# Patient Record
Sex: Female | Born: 1955 | Race: Black or African American | Hispanic: No | Smoking: Never smoker
Health system: Southern US, Community
[De-identification: ages and names within clinical notes are randomized; demographics above are authoritative.]

## PROBLEM LIST (undated history)

## (undated) DIAGNOSIS — I10 Essential (primary) hypertension: Secondary | ICD-10-CM

## (undated) DIAGNOSIS — E119 Type 2 diabetes mellitus without complications: Secondary | ICD-10-CM

## (undated) HISTORY — PX: SHOULDER SURGERY: SHX246

---

## 2004-09-22 ENCOUNTER — Other Ambulatory Visit: Admission: RE | Admit: 2004-09-22 | Discharge: 2004-09-22 | Payer: Self-pay | Admitting: Obstetrics and Gynecology

## 2004-11-25 ENCOUNTER — Encounter (INDEPENDENT_AMBULATORY_CARE_PROVIDER_SITE_OTHER): Payer: Self-pay | Admitting: *Deleted

## 2004-11-25 ENCOUNTER — Ambulatory Visit (HOSPITAL_COMMUNITY): Admission: RE | Admit: 2004-11-25 | Discharge: 2004-11-25 | Payer: Self-pay | Admitting: Obstetrics and Gynecology

## 2007-10-11 ENCOUNTER — Ambulatory Visit: Payer: Self-pay | Admitting: Hematology and Oncology

## 2010-10-03 NOTE — Op Note (Signed)
[name], Mandy Kidd               ACCOUNT NO.:  [id number]   MEDICAL RECORD NO.:  [id number]          PATIENT TYPE:  AMB   LOCATION:  SDC                           FACILITY:  [facility]   PHYSICIAN:  Juluis Mire, M.D.   DATE OF BIRTH:  January 23, 1956   DATE OF PROCEDURE:  11/25/2004  DATE OF DISCHARGE:                                 OPERATIVE REPORT   PREOPERATIVE DIAGNOSIS:  Endometrial polyp.   POSTOPERATIVE DIAGNOSIS:  Endometrial polyp.   OPERATIVE PROCEDURES:  1.  Paracervical block.  2.  Cervical dilatation.  3.  Hysteroscopy with resection of polyps.  4.  Endometrial curettings.   SURGEON:  Juluis Mire, M.D.   ANESTHESIA:  Paracervical block and sedation.   ESTIMATED BLOOD LOSS:  Minimal.   PACKS AND DRAINS:  None.   INTRAOPERATIVE BLOOD REPLACED:  None.   COMPLICATIONS:  None.   INDICATIONS:  As dictated in the history and physical.   PROCEDURE:  The patient was taken to the OR and placed in supine position,  after slight sedation was placed in the dorsal lithotomy position using the  Allen stirrups.  The patient was  then draped as a sterile field.  A  speculum was placed in the vaginal vault.  The cervix and vagina were  cleansed with Betadine.  A paracervical block was instituted using 1%  Nesacaine.  The cervix was secured with a single-tooth tenaculum and the  uterus sounded to 8 cm.  The cervix was serially dilated to a size 35 Pratt  dilator.  The operative hysteroscope was then introduced.  Large polyps were  noted from the anterior and posterior wall.  These were resected using the  resectoscope and sent for pathologic review.  Additional endometrial  sampling was obtained along with curettings.  There was no sign of active  bleeding or perforation.  The total deficit was actually about 90 mL.  The  fluid cord came loose spilling the  sorbitol on the floor, which increased the total deficit.  At this point in  time, the single-tooth tenaculum and speculum  were then removed.  The  patient was taken out of the dorsal lithotomy position and once alert,  transferred to recovery in good condition.       JSM/MEDQ  D:  11/25/2004  T:  11/25/2004  Job:  [id number]

## 2010-10-03 NOTE — H&P (Signed)
NAME:  Mandy Kidd, Mandy Kidd               ACCOUNT NO.:  [id number]   MEDICAL RECORD NO.:  [id number]          PATIENT TYPE:  AMB   LOCATION:  SDC                           FACILITY:  [facility]   PHYSICIAN:  Juluis Mire, M.D.   DATE OF BIRTH:  1956-04-21   DATE OF ADMISSION:  11/25/2004  DATE OF DISCHARGE:                                HISTORY & PHYSICAL   HISTORY OF PRESENT ILLNESS:  The patient is a 55 year old gravida 2, para 2,  postmenopausal female who presents for hysteroscopic evaluation.   In relation to the present admission, the patient had an FSH consistent with  menopause, she was having postmenopausal bleeding.  Subsequent saline  infusion ultrasound revealed several prominent endometrial polyps and a  simple cyst.  She now presents for hysteroscopic resection of the  endometrial polyps.   ALLERGIES:  No known drug allergies.   MEDICATIONS:  None.   PAST MEDICAL HISTORY:  Usual childhood diseases.  Does have a history of  kidney stones and urinary tract infection.  Also a past history of anemia.   PAST SURGICAL HISTORY:  None.  She has had two vaginal deliveries.   FAMILY HISTORY:  Significant in that mother, brother, and sister have a  history of insulin-dependent diabetes mellitus.  Sister also has a history  of hypertension.   SOCIAL HISTORY:  No tobacco or alcohol use.   REVIEW OF SYSTEMS:  Noncontributory.   PHYSICAL EXAMINATION:  VITAL SIGNS:  The patient is afebrile with stable  vital signs.  HEENT:  The patient is normocephalic.  Pupils equal, round, and reactive to  light and accommodation.  Extraocular movements were intact.  Sclerae and  conjunctivae clear.  Oropharynx clear.  NECK:  Without thyromegaly.  BREASTS:  No discrete masses.  LUNGS:  Clear.  HEART:  Regular rate and rhythm without murmurs or gallops.  ABDOMEN:  Benign.  No masses, organomegaly, or tenderness.  PELVIC:  Normal external genitalia.  Vaginal mucosa is clear.  Cervix is  unremarkable.  Uterus is normal size, shape, and contour.  Adnexa free of  masses or tenderness.  EXTREMITIES:  Trace edema.  NEUROLOGY:  Grossly within normal limits.   IMPRESSION:  Postmenopausal bleeding with endometrial polyp.   PLAN:  The patient will undergo hysteroscopic evaluation with resection of  the polyp.  The risks of surgery have been explained including the risks of  infection, the risk of hemorrhage that could require transfusion with the  risk of AIDS or hepatitis.  There is also the risk of hysterectomy should  there be excessive bleeding.  The risk of perforation that could lead to  injury to adjacent organs requiring further exploratory surgery.  There is  the risk of deep venous thrombosis and pulmonary embolism.  The patient  expressed understanding of indications and risks and acceptance of them.       JSM/MEDQ  D:  11/25/2004  T:  11/25/2004  Job:  [id number]

## 2018-04-30 ENCOUNTER — Emergency Department: Payer: No Typology Code available for payment source

## 2018-04-30 ENCOUNTER — Emergency Department
Admission: EM | Admit: 2018-04-30 | Discharge: 2018-04-30 | Disposition: A | Discharge status: Home / Self Care | Payer: No Typology Code available for payment source | Attending: Emergency Medicine | Admitting: Emergency Medicine

## 2018-04-30 ENCOUNTER — Other Ambulatory Visit: Payer: Self-pay

## 2018-04-30 ENCOUNTER — Encounter: Payer: Self-pay | Admitting: Emergency Medicine

## 2018-04-30 DIAGNOSIS — Y9301 Activity, walking, marching and hiking: Secondary | ICD-10-CM | POA: Diagnosis not present

## 2018-04-30 DIAGNOSIS — S59911A Unspecified injury of right forearm, initial encounter: Secondary | ICD-10-CM | POA: Diagnosis present

## 2018-04-30 DIAGNOSIS — S52134A Nondisplaced fracture of neck of right radius, initial encounter for closed fracture: Secondary | ICD-10-CM | POA: Diagnosis not present

## 2018-04-30 DIAGNOSIS — R51 Headache: Secondary | ICD-10-CM | POA: Diagnosis not present

## 2018-04-30 DIAGNOSIS — W109XXA Fall (on) (from) unspecified stairs and steps, initial encounter: Secondary | ICD-10-CM | POA: Diagnosis not present

## 2018-04-30 DIAGNOSIS — Y929 Unspecified place or not applicable: Secondary | ICD-10-CM | POA: Diagnosis not present

## 2018-04-30 DIAGNOSIS — Y999 Unspecified external cause status: Secondary | ICD-10-CM | POA: Insufficient documentation

## 2018-04-30 DIAGNOSIS — W19XXXA Unspecified fall, initial encounter: Secondary | ICD-10-CM

## 2018-04-30 MED ORDER — OXYCODONE-ACETAMINOPHEN 5-325 MG PO TABS: 1.0000 | ORAL_TABLET | Freq: Four times a day (QID) | ORAL | 0 refills | Status: DC | PRN

## 2018-04-30 NOTE — ED Provider Notes (Signed)
[facility] Emergency Department Provider Note  ____________________________________________  Time seen: Approximately 9:16 PM  I have reviewed the triage vital signs and the nursing notes.   HISTORY  Chief Complaint Fall    HPI Mandy Kidd is a 62 y.o. female who presents emergency department complaining of head injury, right arm and elbow pain status post fall.  Patient reports she was coming down a flight of stairs, missed 1 of the steps causing her to fall.  She tried to catch herself with her right arm but is unsure how she landed on same.  She did strike her head on the banister as well as the stair.  No loss of consciousness.  Patient denies any headache or neck pain but endorses significant right arm pain.  No radicular symptoms in the upper or lower extremity.  No bowel or bladder dysfunction or saddle anesthesia.  No medications for his complaint prior to arrival.  Patient drove herself.  Patient denies any other complaint at this time.    History reviewed. No pertinent past medical history.  There are no active problems to display for this patient.   History reviewed. No pertinent surgical history.  Prior to Admission medications   Medication Sig Start Date End Date Taking? Authorizing Provider  oxyCODONE-acetaminophen (PERCOCET/ROXICET) 5-325 MG tablet Take 1 tablet by mouth every 6 (six) hours as needed for severe pain. 04/30/18   Gerry Blanchfield, Delorise RoyalsJonathan D, PA-C    Allergies Patient has no known allergies.  No family history on file.  Social History Social History   Tobacco Use  . Smoking status: Not on file  Substance Use Topics  . Alcohol use: Not on file  . Drug use: Not on file     Review of Systems  Constitutional: No fever/chills.  Patient did not hit her head. Eyes: No visual changes. No discharge ENT: No upper respiratory complaints. Cardiovascular: no chest pain. Respiratory: no cough. No SOB. Gastrointestinal: No  abdominal pain.  No nausea, no vomiting.  No diarrhea.  No constipation. Musculoskeletal: Positive for right arm pain Skin: Negative for rash, abrasions, lacerations, ecchymosis. Neurological: Negative for headaches, focal weakness or numbness. 10-point ROS otherwise negative.  ____________________________________________   PHYSICAL EXAM:  VITAL SIGNS: ED Triage Vitals  Enc Vitals Group     BP 04/30/18 2043 (!) 160/69     Pulse Rate 04/30/18 2043 (!) 104     Resp 04/30/18 2043 18     Temp 04/30/18 2043 97.8 F (36.6 C)     Temp Source 04/30/18 2043 Oral     SpO2 04/30/18 2043 99 %     Weight 04/30/18 2044 260 lb (117.9 kg)     Height 04/30/18 2044 5\' 6"  (1.676 m)     Head Circumference --      Peak Flow --      Pain Score 04/30/18 2043 8     Pain Loc --      Pain Edu? --      Excl. in GC? --      Constitutional: Alert and oriented. Well appearing and in no acute distress. Eyes: Conjunctivae are normal. PERRL. EOMI. Head: Small hematoma noted to the right parietal/frontal region.  Patient with no abrasions or lacerations.  Patient is tender to palpation over hematoma with no tenderness palpation of the osseous structures of the skull and face.  No palpable abnormality or crepitus.  No battle signs, raccoon eyes, serosanguineous fluid drainage from the ears or nares. ENT:  Ears:       Nose: No congestion/rhinnorhea.      Mouth/Throat: Mucous membranes are moist.  Neck: No stridor.  No cervical spine tenderness to palpation  Cardiovascular: Normal rate, regular rhythm. Normal S1 and S2.  Good peripheral circulation. Respiratory: Normal respiratory effort without tachypnea or retractions. Lungs CTAB. Good air entry to the bases with no decreased or absent breath sounds. Musculoskeletal: Full range of motion to all extremities. No gross deformities appreciated.  Visualization of the right upper extremity reveals no gross signs of trauma with abrasions, lacerations,  ecchymosis, deformity.  Patient has limited range of motion of her elbow due to pain.  She is tender to palpation over the olecranon process, radial head, distal third of the humerus.  No palpable abnormality.  Examination of the shoulder and wrist is unremarkable.  Radial pulse intact distally.  Sensation intact distally. Neurologic:  Normal speech and language. No gross focal neurologic deficits are appreciated.  Cranial nerves II through XII grossly intact. Skin:  Skin is warm, dry and intact. No rash noted. Psychiatric: Mood and affect are normal. Speech and behavior are normal. Patient exhibits appropriate insight and judgement.   ____________________________________________   LABS (all labs ordered are listed, but only abnormal results are displayed)  Labs Reviewed - No data to display ____________________________________________  EKG   ____________________________________________  RADIOLOGY I personally viewed and evaluated these images as part of my medical decision making, as well as reviewing the written report by the radiologist.  I concur with radiologist finding of nondisplaced radial neck fracture.  Dg Elbow Complete Right  Result Date: 04/30/2018 CLINICAL DATA:  Fall down stairs.  Right upper arm pain. EXAM: RIGHT ELBOW - COMPLETE 3+ VIEW COMPARISON:  None. FINDINGS: There is a right elbow joint effusion. Nondisplaced fracture noted through the right radial neck. No subluxation or dislocation. IMPRESSION: Nondisplaced radial neck fracture with associated joint effusion. Electronically Signed   By: Charlett Nose M.D.   On: 04/30/2018 21:51   Ct Head Wo Contrast  Result Date: 04/30/2018 CLINICAL DATA:  Missed a step at work tonight leading to fall striking right arm and head. No loss of consciousness. Facial trauma, fx suspected; C-spine fx, traumatic EXAM: CT HEAD WITHOUT CONTRAST CT CERVICAL SPINE WITHOUT CONTRAST TECHNIQUE: Multidetector CT imaging of the head and  cervical spine was performed following the standard protocol without intravenous contrast. Multiplanar CT image reconstructions of the cervical spine were also generated. COMPARISON:  None. FINDINGS: CT HEAD FINDINGS Brain: No intracranial hemorrhage, mass effect, or midline shift. No hydrocephalus. Incidental cavum septum pellucidum, normal variant. The basilar cisterns are patent. No evidence of territorial infarct or acute ischemia. No extra-axial or intracranial fluid collection. Vascular: No hyperdense vessel or unexpected calcification. Skull: No fracture or focal lesion. Sinuses/Orbits: Trace mucosal thickening of right ethmoid air cells. No sinus fluid level or evidence of fracture. Mastoid air cells are clear. Other: None. CT CERVICAL SPINE FINDINGS Alignment: Straightening of normal lordosis. No traumatic subluxation. Skull base and vertebrae: No acute fracture. Vertebral body heights are maintained. The dens and skull base are intact. Soft tissues and spinal canal: No prevertebral fluid or swelling. No visible canal hematoma. Disc levels: Minor endplate spurring, less than typically seen for patient's age. Minimal C5-C6 disc space narrowing. Upper chest: Negative. Other: None. IMPRESSION: 1. No acute intracranial abnormality. No skull fracture. 2. No fracture or subluxation of the cervical spine. Electronically Signed   By: Narda Rutherford M.D.   On: 04/30/2018 22:01  Ct Cervical Spine Wo Contrast  Result Date: 04/30/2018 CLINICAL DATA:  Missed a step at work tonight leading to fall striking right arm and head. No loss of consciousness. Facial trauma, fx suspected; C-spine fx, traumatic EXAM: CT HEAD WITHOUT CONTRAST CT CERVICAL SPINE WITHOUT CONTRAST TECHNIQUE: Multidetector CT imaging of the head and cervical spine was performed following the standard protocol without intravenous contrast. Multiplanar CT image reconstructions of the cervical spine were also generated. COMPARISON:  None. FINDINGS:  CT HEAD FINDINGS Brain: No intracranial hemorrhage, mass effect, or midline shift. No hydrocephalus. Incidental cavum septum pellucidum, normal variant. The basilar cisterns are patent. No evidence of territorial infarct or acute ischemia. No extra-axial or intracranial fluid collection. Vascular: No hyperdense vessel or unexpected calcification. Skull: No fracture or focal lesion. Sinuses/Orbits: Trace mucosal thickening of right ethmoid air cells. No sinus fluid level or evidence of fracture. Mastoid air cells are clear. Other: None. CT CERVICAL SPINE FINDINGS Alignment: Straightening of normal lordosis. No traumatic subluxation. Skull base and vertebrae: No acute fracture. Vertebral body heights are maintained. The dens and skull base are intact. Soft tissues and spinal canal: No prevertebral fluid or swelling. No visible canal hematoma. Disc levels: Minor endplate spurring, less than typically seen for patient's age. Minimal C5-C6 disc space narrowing. Upper chest: Negative. Other: None. IMPRESSION: 1. No acute intracranial abnormality. No skull fracture. 2. No fracture or subluxation of the cervical spine. Electronically Signed   By: Narda Rutherford M.D.   On: 04/30/2018 22:01   Dg Humerus Right  Result Date: 04/30/2018 CLINICAL DATA:  Fall down stairs.  Right humeral pain EXAM: RIGHT HUMERUS - 2+ VIEW COMPARISON:  None. FINDINGS: There is no evidence of fracture or other focal bone lesions. Soft tissues are unremarkable. IMPRESSION: Negative. Electronically Signed   By: Charlett Nose M.D.   On: 04/30/2018 21:50    ____________________________________________    PROCEDURES  Procedure(s) performed:    .Splint Application Date/Time: 04/30/2018 10:11 PM Performed by: Racheal Patches, PA-C Authorized by: Racheal Patches, PA-C   Consent:    Consent obtained:  Verbal   Consent given by:  Patient   Risks discussed:  Pain and swelling Pre-procedure details:    Sensation:   Normal Procedure details:    Laterality:  Right   Location:  Elbow   Elbow:  R elbow   Splint type:  Long arm   Supplies:  Sling Post-procedure details:    Pain:  Improved   Sensation:  Normal   Patient tolerance of procedure:  Tolerated well, no immediate complications      Medications - No data to display   ____________________________________________   INITIAL IMPRESSION / ASSESSMENT AND PLAN / ED COURSE  Pertinent labs & imaging results that were available during my care of the patient were reviewed by me and considered in my medical decision making (see chart for details).  Review of the [place] CSRS was performed in accordance of the [facility] prior to dispensing any controlled drugs.      Patient's diagnosis is consistent with fall resulting in radial neck fracture.  Patient presents emergency department after falling on her flight of stairs.  She did hit her head but did not lose consciousness.  Overall, neuro exam is very reassuring.  However, given mechanism of injury CT scan of the head and neck are ordered.  These returned without any acute intracranial or osseous abnormality to the head or neck.  Patient was also evaluated with x-rays to her right upper  extremity.  This returns with nondisplaced radial neck fracture.  Patient is placed in a long-arm splint and given a sling.  Follow-up with orthopedics for further management.  Patient will be prescribed Percocet for pain relief..  Patient is given ED precautions to return to the ED for any worsening or new symptoms.     ____________________________________________  FINAL CLINICAL IMPRESSION(S) / ED DIAGNOSES  Final diagnoses:  Closed nondisplaced fracture of neck of right radius, initial encounter  Fall, initial encounter      NEW MEDICATIONS STARTED DURING THIS VISIT:  ED Discharge Orders         Ordered    oxyCODONE-acetaminophen (PERCOCET/ROXICET) 5-325 MG tablet  Every 6 hours PRN     04/30/18 2201               This chart was dictated using voice recognition software/Dragon. Despite best efforts to proofread, errors can occur which can change the meaning. Any change was purely unintentional.    Racheal Patches, PA-C 04/30/18 2211    Jene Every, MD 04/30/18 2245

## 2018-04-30 NOTE — ED Notes (Signed)
Pt states that while at work tonight she was missed a step and fell, landing on her right arm and her head. Pt denies LOC. Pt has small knot on front of head. Right arm is swollen and giving her 8/10 pain

## 2018-04-30 NOTE — ED Triage Notes (Signed)
Patient reports she miss stepped and fell down 2 steps.  Reports hit head on railing (denies loss of consciousness) and landed on right side.  Patient mostly complains of right arm pain.

## 2018-07-15 ENCOUNTER — Emergency Department
Admission: EM | Admit: 2018-07-15 | Discharge: 2018-07-15 | Disposition: A | Discharge status: Home / Self Care | Payer: No Typology Code available for payment source | Attending: Emergency Medicine | Admitting: Emergency Medicine

## 2018-07-15 ENCOUNTER — Encounter: Payer: Self-pay | Admitting: Emergency Medicine

## 2018-07-15 ENCOUNTER — Emergency Department: Payer: No Typology Code available for payment source

## 2018-07-15 DIAGNOSIS — Z79899 Other long term (current) drug therapy: Secondary | ICD-10-CM | POA: Diagnosis not present

## 2018-07-15 DIAGNOSIS — M25511 Pain in right shoulder: Secondary | ICD-10-CM | POA: Diagnosis not present

## 2018-07-15 DIAGNOSIS — G8929 Other chronic pain: Secondary | ICD-10-CM | POA: Diagnosis not present

## 2018-07-15 NOTE — ED Triage Notes (Signed)
Pt reports fell in December and hurt her arm at work and has been getting PT for it. Pt reports now they think her rotator cuff is injured so they advised her to get a xray of it. Denies recent or new injuries. Pt right arm is injured.

## 2018-07-15 NOTE — ED Provider Notes (Signed)
[facility] Emergency Department Provider Note  ____________________________________________  Time seen: Approximately 2:33 PM  I have reviewed the triage vital signs and the nursing notes.   HISTORY  Chief Complaint Arm Injury    HPI Mandy Kidd is a 63 y.o. female that presents emergency department for evaluation of right shoulder pain for 2 months.  Certain movements elicit pain.  Pain is primarily over the top.  Patient states that she is going to physical therapy for a Workmen's Comp. injury that happened back in December.  Physical therapy suggested that she have an x-ray completed, as a suspect that there is a rotator cuff tear.  She is seeing orthopedics in [place].  No recent trauma.  History reviewed. No pertinent past medical history.  There are no active problems to display for this patient.   History reviewed. No pertinent surgical history.  Prior to Admission medications   Medication Sig Start Date End Date Taking? Authorizing Provider  oxyCODONE-acetaminophen (PERCOCET/ROXICET) 5-325 MG tablet Take 1 tablet by mouth every 6 (six) hours as needed for severe pain. 04/30/18   Cuthriell, Delorise Royals, PA-C    Allergies Patient has no known allergies.  No family history on file.  Social History Social History   Tobacco Use  . Smoking status: Not on file  Substance Use Topics  . Alcohol use: Not on file  . Drug use: Not on file     Review of Systems  Cardiovascular: No chest pain. Respiratory: No SOB. Musculoskeletal: Positive for shoulder pain. Skin: Negative for rash, abrasions, lacerations, ecchymosis. Neurological: Negative for numbness or tingling   ____________________________________________   PHYSICAL EXAM:  VITAL SIGNS: ED Triage Vitals  Enc Vitals Group     BP 07/15/18 1317 (!) 142/65     Pulse Rate 07/15/18 1317 80     Resp 07/15/18 1317 20     Temp 07/15/18 1317 98.2 F (36.8 C)     Temp Source 07/15/18  1317 Oral     SpO2 07/15/18 1317 97 %     Weight 07/15/18 1309 230 lb (104.3 kg)     Height 07/15/18 1309 5\' 6"  (1.676 m)     Head Circumference --      Peak Flow --      Pain Score 07/15/18 1309 6     Pain Loc --      Pain Edu? --      Excl. in GC? --      Constitutional: Alert and oriented. Well appearing and in no acute distress. Eyes: Conjunctivae are normal. PERRL. EOMI. Head: Atraumatic. ENT:      Ears:      Nose: No congestion/rhinnorhea.      Mouth/Throat: Mucous membranes are moist.  Neck: No stridor.  Cardiovascular: Normal rate, regular rhythm.  Good peripheral circulation. Respiratory: Normal respiratory effort without tachypnea or retractions. Lungs CTAB. Good air entry to the bases with no decreased or absent breath sounds. Musculoskeletal: Full range of motion to all extremities. No gross deformities appreciated.  Minimal tenderness to palpation over superior shoulder.  Full range of motion of shoulder.  Strength equal in upper extremities bilaterally. Neurologic:  Normal speech and language. No gross focal neurologic deficits are appreciated.  Skin:  Skin is warm, dry and intact. No rash noted. Psychiatric: Mood and affect are normal. Speech and behavior are normal. Patient exhibits appropriate insight and judgement.   ____________________________________________   LABS (all labs ordered are listed, but only abnormal results are displayed)  Labs  Reviewed - No data to display ____________________________________________  EKG   ____________________________________________  RADIOLOGY  No results found.  ____________________________________________    PROCEDURES  Procedure(s) performed:    Procedures    Medications - No data to display   ____________________________________________   INITIAL IMPRESSION / ASSESSMENT AND PLAN / ED COURSE  Pertinent labs & imaging results that were available during my care of the patient were reviewed by me  and considered in my medical decision making (see chart for details).  Review of the [place] CSRS was performed in accordance of the [facility] prior to dispensing any controlled drugs.   Patient presented to the emergency department requesting an x-ray of right shoulder for chronic pain and trauma 2 months ago.  We discussed that an x-ray would not show a rotator cuff tear.  Patient received a call from her Worker's Comp. case worker and was told that she have to get prior authorization for an x-ray.  Patient declines having any imaging today and will follow-up with her surgeon in [place] and [place].  She will continue to take her home prescription of Percocet for pain. Patient is given ED precautions to return to the ED for any worsening or new symptoms.     ____________________________________________  FINAL CLINICAL IMPRESSION(S) / ED DIAGNOSES  Final diagnoses:  Chronic right shoulder pain      NEW MEDICATIONS STARTED DURING THIS VISIT:  ED Discharge Orders    None          This chart was dictated using voice recognition software/Dragon. Despite best efforts to proofread, errors can occur which can change the meaning. Any change was purely unintentional.    Enid Derry, PA-C 07/15/18 1522    Arnaldo Natal, MD 07/15/18 [phone]

## 2018-07-15 NOTE — ED Notes (Signed)

## 2019-11-11 IMAGING — CT CT CERVICAL SPINE W/O CM
3 of 7 series · 10 of 33 positions shown, 11 images · non-contrast
Comparison: None.

CLINICAL DATA: Missed a step at work tonight leading to fall
striking right arm and head. No loss of consciousness. Facial
trauma, fx suspected; C-spine fx, traumatic

EXAM:
CT HEAD WITHOUT CONTRAST
CT CERVICAL SPINE WITHOUT CONTRAST
TECHNIQUE: Multidetector CT imaging of the head and cervical spine was
performed following the standard protocol without intravenous
contrast. Multiplanar CT image reconstructions of the cervical spine
were also generated.

[Series 6: sagittal bone · sagittal · 0.20mm/px · 5 of 69 slices shown]
[im 10/69  bone]
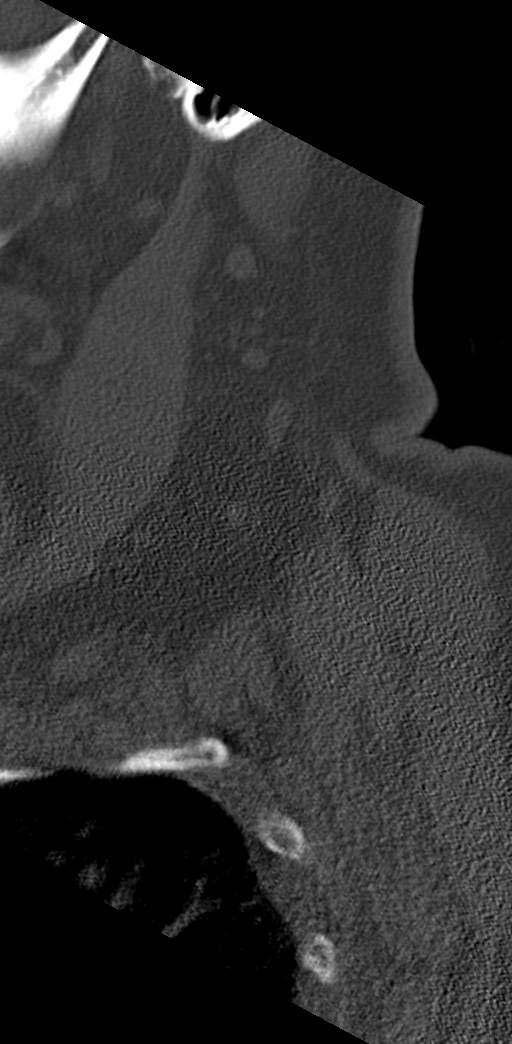
[im 20/69  bone]
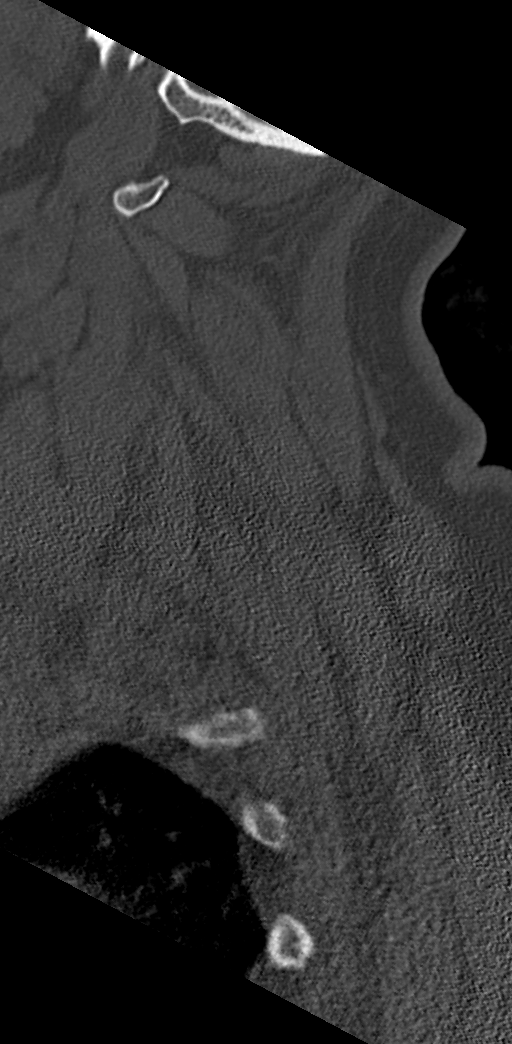
[im 30/69  bone]
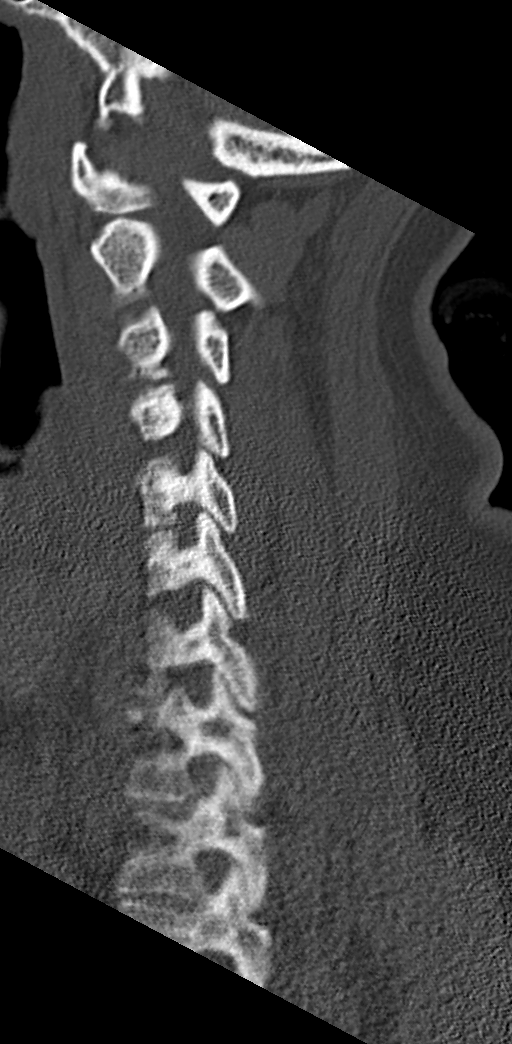
[im 39/69  bone]
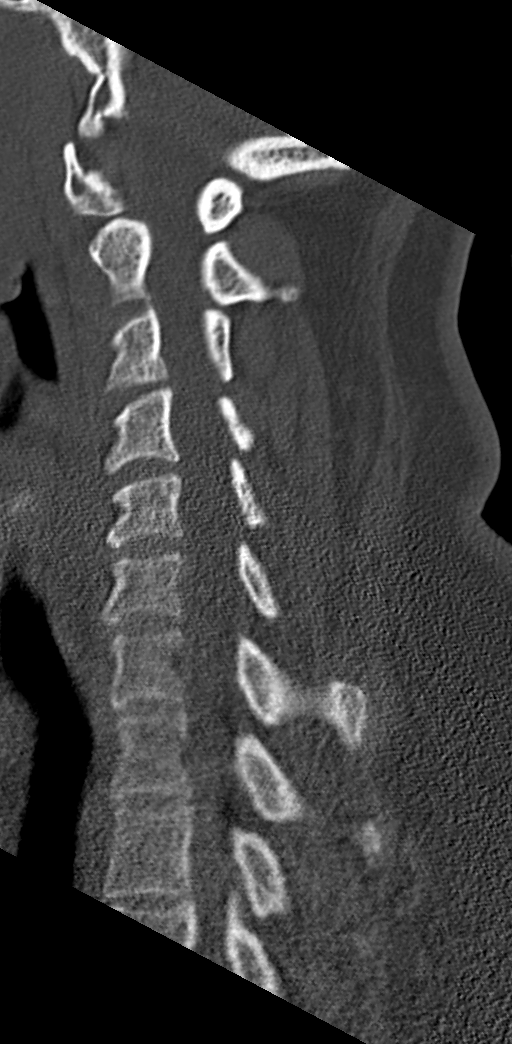
[im 49/69  bone]
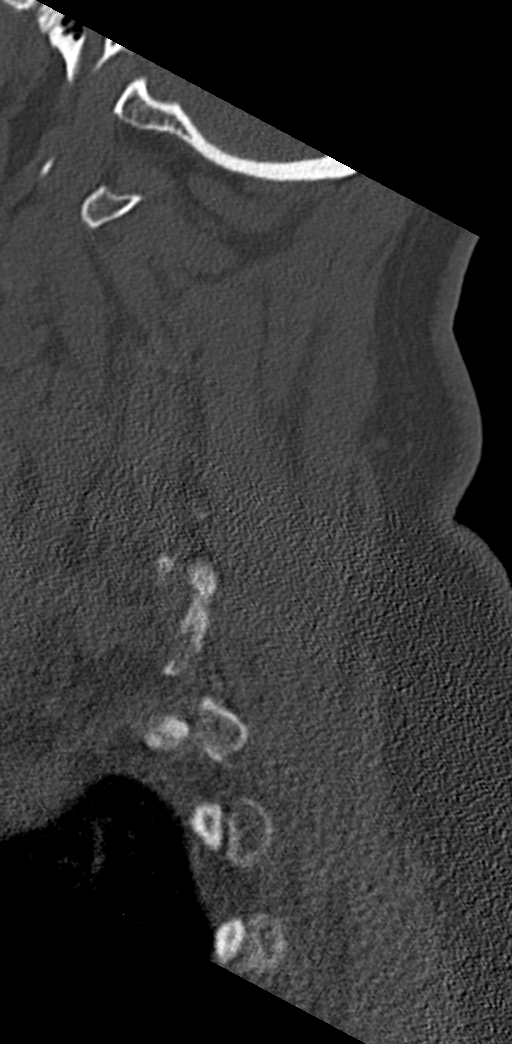

[Series 9: coronal bone · coronal · 0.27mm/px · 3 of 51 slices shown]
[im 13/51  bone]
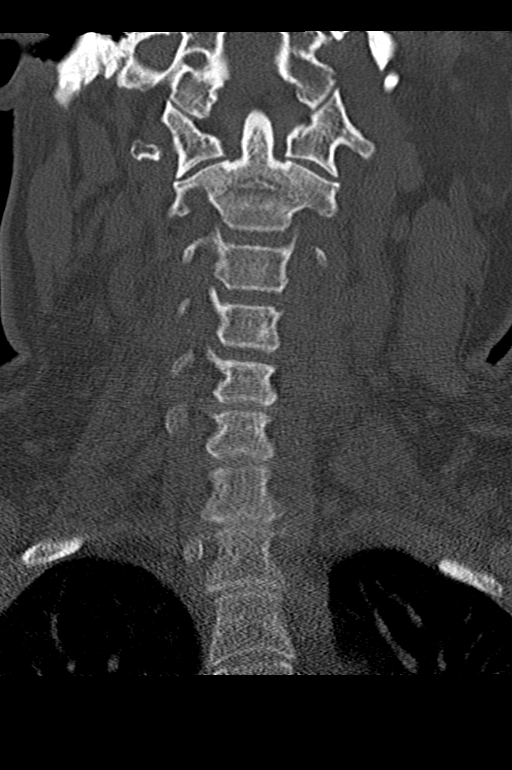
[im 26/51  bone]
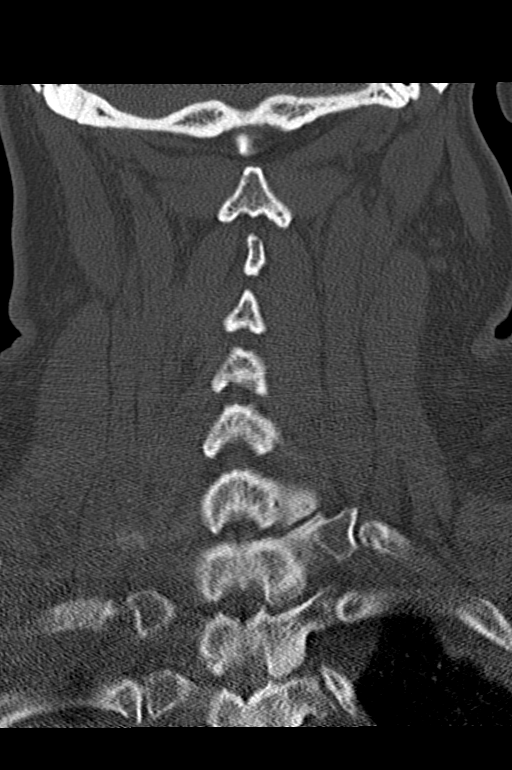
[im 38/51  bone]
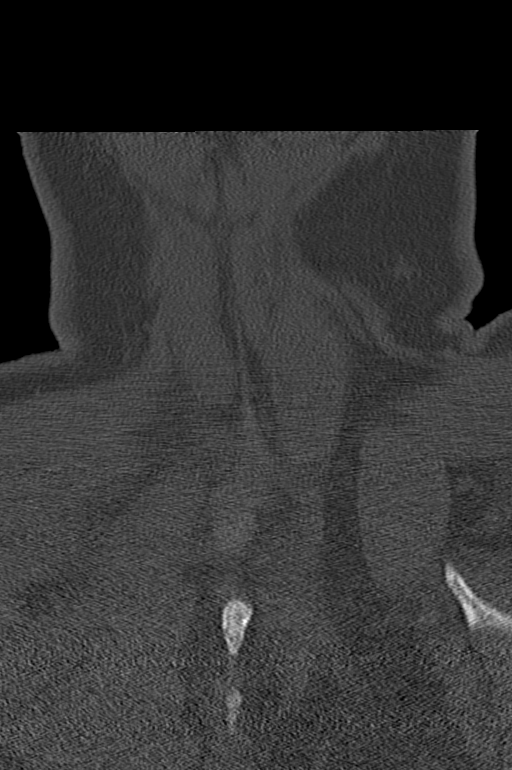

[Series 10: orthogonal bone · axial · 0.20mm/px · z∈[-304,-244]mm · 2 of 104 slices shown, 3 images]
[im 35/104  soft-tissue]
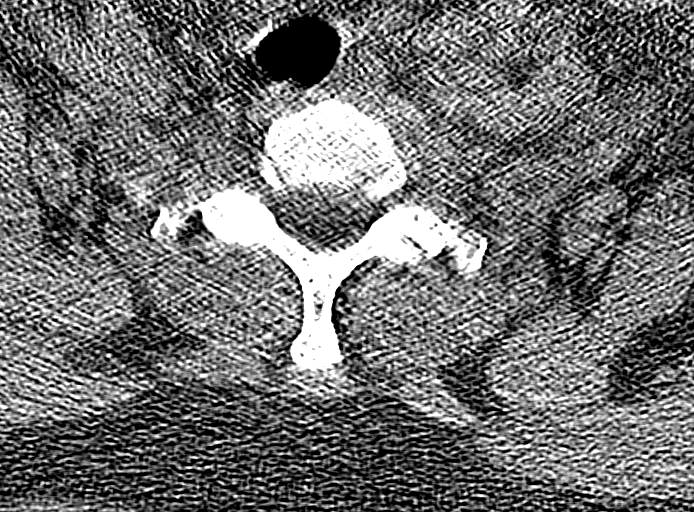
[im 35/104  bone]
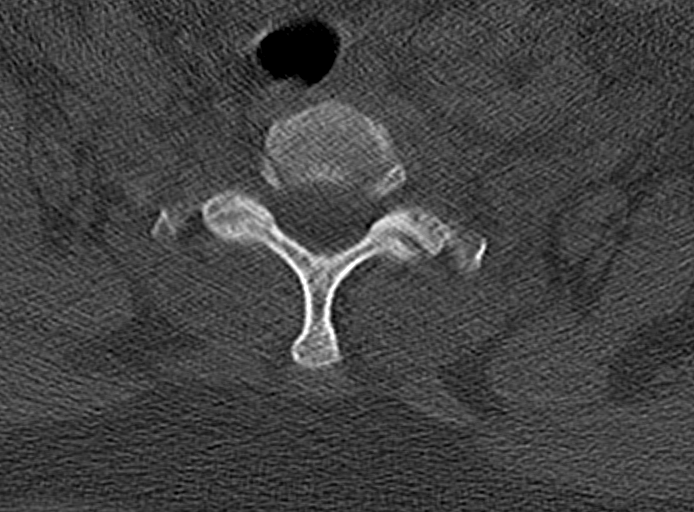
[im 69/104  bone]
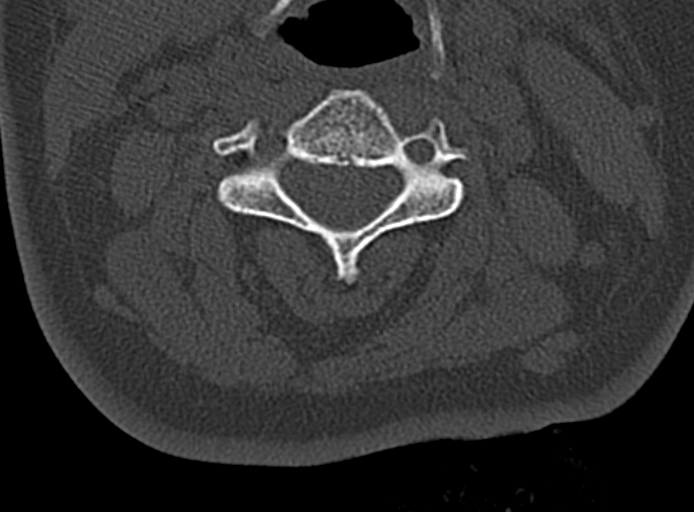

[10 of 33 positions shown; findings below may reference images not displayed]

FINDINGS: CT HEAD FINDINGS

Brain: No intracranial hemorrhage, mass effect, or midline shift. No
hydrocephalus. Incidental cavum septum pellucidum, normal variant.
The basilar cisterns are patent. No evidence of territorial infarct
or acute ischemia. No extra-axial or intracranial fluid collection.

Vascular: No hyperdense vessel or unexpected calcification.

Skull: No fracture or focal lesion.

Sinuses/Orbits: Trace mucosal thickening of right ethmoid air cells.
No sinus fluid level or evidence of fracture. Mastoid air cells are
clear.

Other: None.

CT CERVICAL SPINE FINDINGS

Alignment: Straightening of normal lordosis. No traumatic
subluxation.

Skull base and vertebrae: No acute fracture. Vertebral body heights
are maintained. The dens and skull base are intact.

Soft tissues and spinal canal: No prevertebral fluid or swelling. No
visible canal hematoma.

Disc levels: Minor endplate spurring, less than typically seen for
patient's age. Minimal C5-C6 disc space narrowing.

Upper chest: Negative.

Other: None.
IMPRESSION: 1. No acute intracranial abnormality. No skull fracture.
2. No fracture or subluxation of the cervical spine.

## 2019-11-11 IMAGING — CR DG ELBOW COMPLETE 3+V*R*
1 series · 5 of 5 positions shown · non-contrast
Comparison: None.

CLINICAL DATA: Fall down stairs.  Right upper arm pain.

EXAM:
RIGHT ELBOW - COMPLETE 3+ VIEW

[Series 1: dg elbow complete right (3+view) · 0.14mm/px · 5 of 5 slices shown]
[im 1/5]
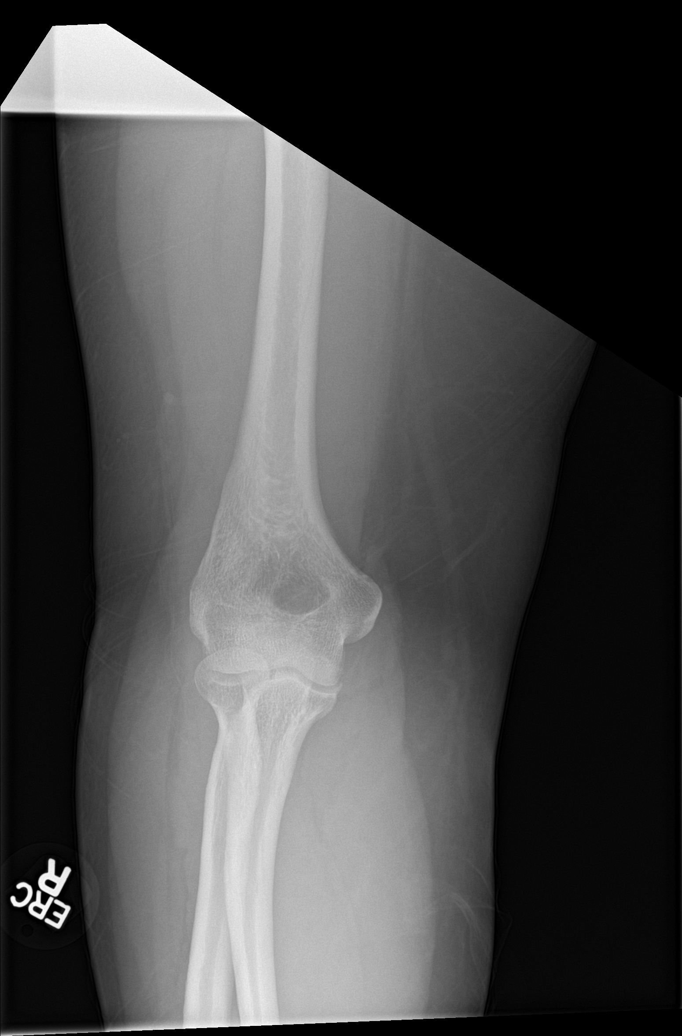
[im 2/5]
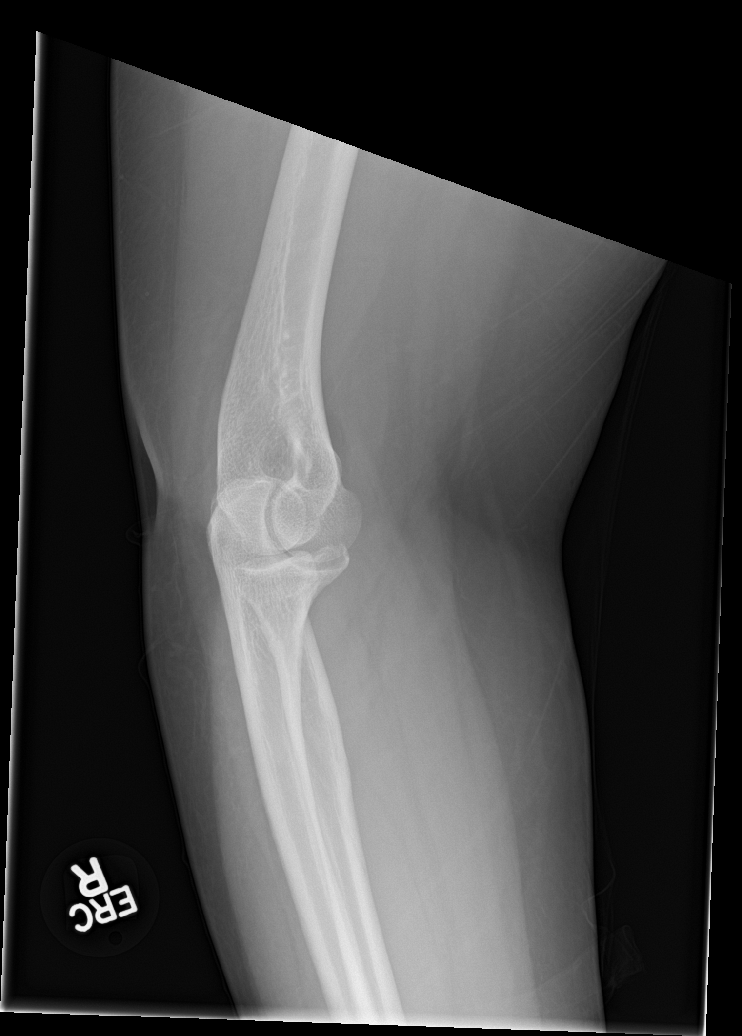
[im 3/5]
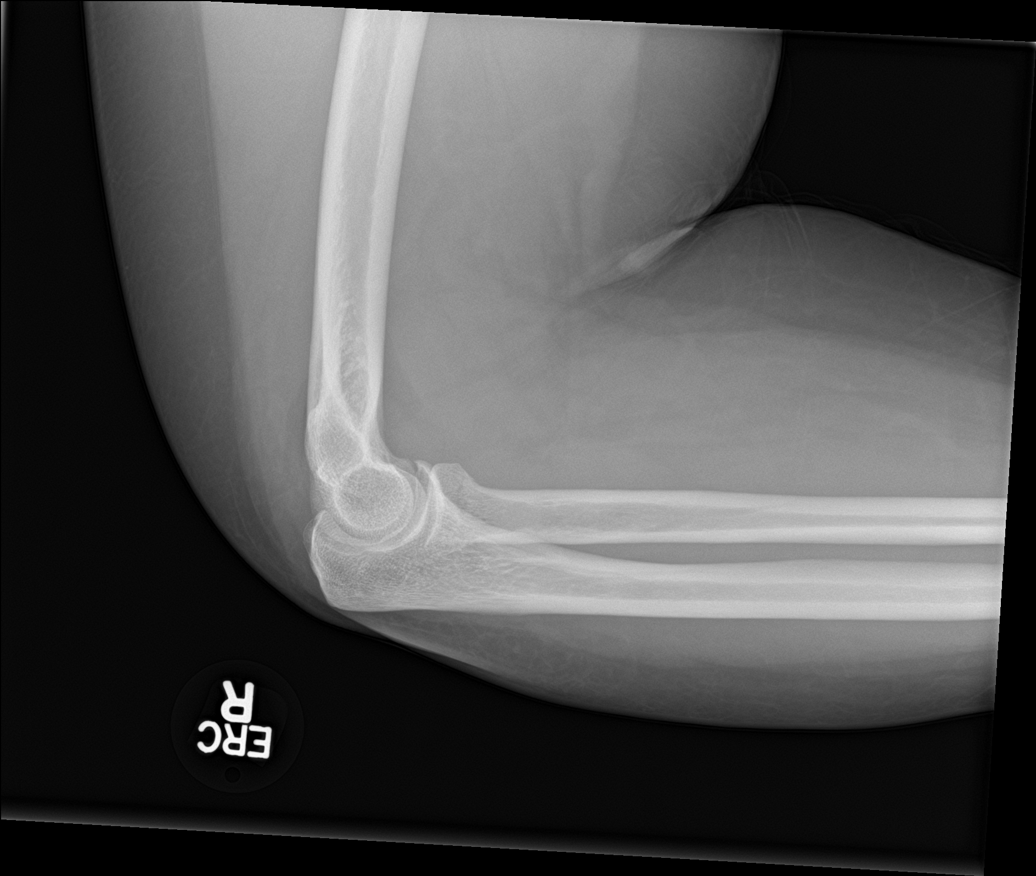
[im 4/5]
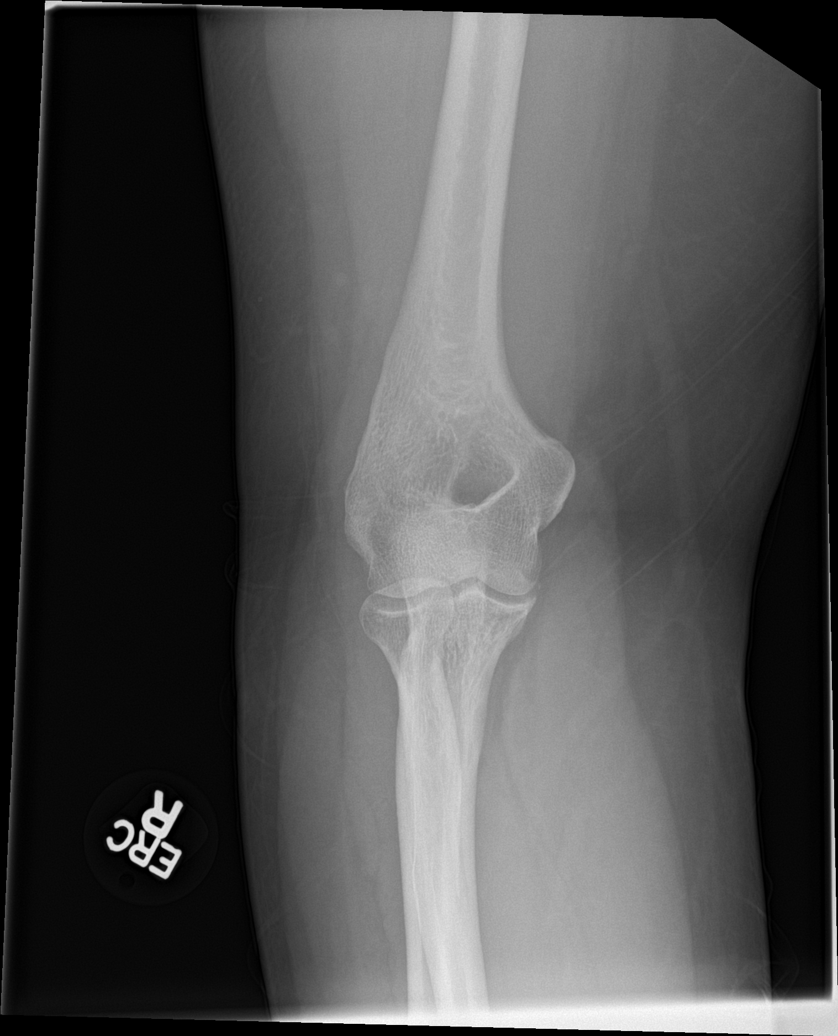
[im 5/5]
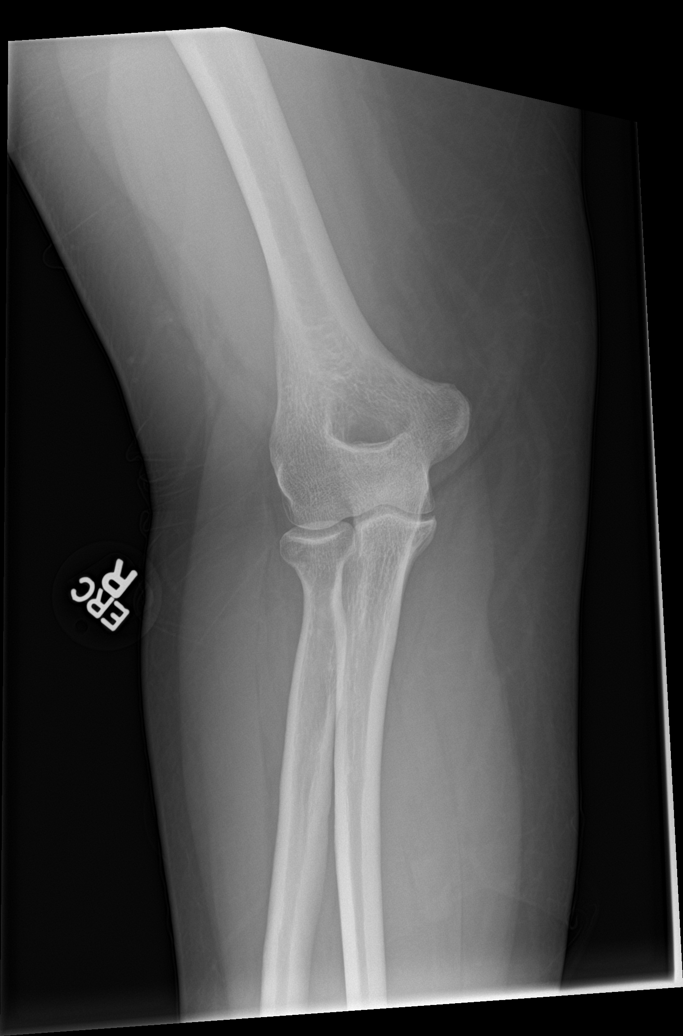

[5 of 5 positions shown; findings below may reference images not displayed]

FINDINGS: There is a right elbow joint effusion. Nondisplaced fracture noted
through the right radial neck. No subluxation or dislocation.
IMPRESSION: Nondisplaced radial neck fracture with associated joint effusion.

## 2019-12-28 ENCOUNTER — Other Ambulatory Visit: Payer: Self-pay

## 2019-12-28 ENCOUNTER — Encounter: Payer: Self-pay | Admitting: Emergency Medicine

## 2019-12-28 ENCOUNTER — Ambulatory Visit
Admission: EM | Admit: 2019-12-28 | Discharge: 2019-12-28 | Disposition: A | Discharge status: Home / Self Care | Payer: BC Managed Care – PPO | Attending: Family Medicine | Admitting: Family Medicine

## 2019-12-28 DIAGNOSIS — R109 Unspecified abdominal pain: Secondary | ICD-10-CM

## 2019-12-28 HISTORY — DX: Type 2 diabetes mellitus without complications: E11.9

## 2019-12-28 HISTORY — DX: Essential (primary) hypertension: I10

## 2019-12-28 LAB — URINALYSIS, COMPLETE (UACMP) WITH MICROSCOPIC
Bacteria, UA: NONE SEEN
Bilirubin Urine: NEGATIVE
Glucose, UA: NEGATIVE mg/dL
Ketones, ur: NEGATIVE mg/dL
Leukocytes,Ua: NEGATIVE
Nitrite: NEGATIVE
Protein, ur: NEGATIVE mg/dL
Specific Gravity, Urine: 1.015 (ref 1.005–1.030)
Squamous Epithelial / HPF: NONE SEEN (ref 0–5)
pH: 6 (ref 5.0–8.0)

## 2019-12-28 MED ORDER — TRAMADOL HCL 50 MG PO TABS: 50.0000 mg | ORAL_TABLET | Freq: Three times a day (TID) | ORAL | 0 refills | Status: AC | PRN

## 2019-12-28 NOTE — ED Provider Notes (Signed)
[facility]    CSN: [id number] Arrival date & time: 12/28/19  1305      History   Chief Complaint Chief Complaint  Patient presents with  . Dysuria  . Back Pain   HPI  64 year old female presents with concerned that she has a "kidney infection".  This appears to be an ongoing/chronic problem.  She is followed by urology.  Most recent visit to urology reviewed.  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  It looks like the plan is to have her follow-up with Dr. Celso Sickle for consideration of pyeloplasty versus trial of stent versus supportive care.  Patient reports that she is had symptoms for the past 2 months.  She reports left flank pain/back pain.  She also reports malodorous urine and left lower abdominal pain.  No relieving factors.  No other reported symptoms.  No other complaints or concerns this time.  Past Medical History:  Diagnosis Date  . Diabetes mellitus without complication (HCC)   . Hypertension    Past Surgical History:  Procedure Laterality Date  . SHOULDER SURGERY      OB History   No obstetric history on file.      Home Medications    Prior to Admission medications   Medication Sig Start Date End Date Taking? Authorizing Provider  amLODipine (NORVASC) 10 MG tablet Take by mouth. 04/21/19  Yes [provider]  aspirin 81 MG EC tablet Take by mouth. 08/29/19 05/07/33 Yes [provider]  atorvastatin (LIPITOR) 40 MG tablet Take by mouth. 08/29/19 08/28/20 Yes [provider]  lisinopril-hydrochlorothiazide (ZESTORETIC) 10-12.5 MG tablet Take 1 tablet by mouth daily. 08/29/19  Yes [provider]  metFORMIN (GLUCOPHAGE) 1000 MG tablet Take by mouth. 08/29/19 08/28/20 Yes [provider]  traMADol (ULTRAM) 50 MG tablet Take 1 tablet (50 mg total) by mouth every 8 (eight) hours as needed. 12/28/19   Tommie Sams, DO   Social History Social History   Tobacco Use  . Smoking status: Never Smoker  . Smokeless tobacco: Never Used  Vaping Use  . Vaping Use: Never used  Substance Use Topics  . Alcohol use: Never  . Drug use: Never    Allergies   Patient has no known allergies.   Review of Systems Review of Systems  Genitourinary: Positive for flank pain.       Urine odor.  Musculoskeletal: Positive for back pain.   Physical Exam Triage Vital Signs ED Triage Vitals  Enc Vitals Group     BP 12/28/19 1438 (!) 149/83     Pulse Rate 12/28/19 1438 75     Resp 12/28/19 1438 18     Temp 12/28/19 1438 98 F (36.7 C)     Temp Source 12/28/19 1438 Oral     SpO2 12/28/19 1438 100 %     Weight 12/28/19 1436 230 lb (104.3 kg)     Height 12/28/19 1436 5\' 6"  (1.676 m)     Head Circumference --      Peak Flow --      Pain Score 12/28/19 1435 7     Pain Loc --      Pain Edu? --      Excl. in GC? --    Updated Vital Signs BP (!) 149/83 (BP Location: Right Arm)   Pulse 75   Temp 98 F (36.7 C) (  Oral)   Resp 18   Ht 5\' 6"  (1.676 m)   Wt 104.3 kg   SpO2 100%   BMI 37.12 kg/m   Visual Acuity Right Eye Distance:   Left Eye Distance:   Bilateral Distance:    Right Eye Near:   Left Eye Near:    Bilateral Near:     Physical Exam Vitals reviewed.  Constitutional:      General: She is not in acute distress.    Appearance: Normal appearance. She is obese. She is not ill-appearing.  HENT:     Head: Normocephalic and atraumatic.  Eyes:     General:        Right eye: No discharge.        Left eye: No discharge.     Conjunctiva/sclera: Conjunctivae normal.  Cardiovascular:     Rate and Rhythm: Normal rate and regular rhythm.  Pulmonary:     Effort: Pulmonary effort is normal.     Breath sounds: Normal breath sounds. No wheezing, rhonchi or rales.  Abdominal:     General: There is no distension.     Palpations: Abdomen is soft.     Tenderness: There is no abdominal  tenderness.  Neurological:     Mental Status: She is alert.  Psychiatric:        Mood and Affect: Mood normal.        Behavior: Behavior normal.    UC Treatments / Results  Labs (all labs ordered are listed, but only abnormal results are displayed) Labs Reviewed  URINALYSIS, COMPLETE (UACMP) WITH MICROSCOPIC - Abnormal; Notable for the following components:      Result Value   Hgb urine dipstick SMALL (*)    All other components within normal limits    EKG   Radiology No results found.  Procedures Procedures (including critical care time)  Medications Ordered in UC Medications - No data to display  Initial Impression / Assessment and Plan / UC Course  I have reviewed the triage vital signs and the nursing notes.  Pertinent labs & imaging results that were available during my care of the patient were reviewed by me and considered in my medical decision making (see chart for details).    64 year old female presents with flank pain/back pain and urinary odor.  No evidence of UTI.  Patient has ongoing urological issues.  See HPI.  Advised to follow-up with urology.  Tramadol for pain.  Final Clinical Impressions(s) / UC Diagnoses   Final diagnoses:  Left flank pain     Discharge Instructions     No evidence of UTI.  Please discuss continued left flank pain with your urologist.  It appears that you have some ongoing issues that need to be addressed from a urological perspective.  Pain medication as needed.  Take care  Dr. [id number]     ED Prescriptions    Medication Sig Dispense Auth. Provider   traMADol (ULTRAM) 50 MG tablet Take 1 tablet (50 mg total) by mouth every 8 (eight) hours as needed. 15 tablet Adriana Simas G, DO     I have reviewed the PDMP during this encounter.   Everlene Other, Tommie Sams 12/28/19 1550

## 2019-12-28 NOTE — ED Triage Notes (Signed)
Patient c/o lower abdominal pain and back pain, hematuria and dysuria that started 4-5 weeks ago.

## 2019-12-28 NOTE — Discharge Instructions (Signed)
No evidence of UTI.  Please discuss continued left flank pain with your urologist.  It appears that you have some ongoing issues that need to be addressed from a urological perspective.  Pain medication as needed.  Take care  Dr. Adriana Simas
